# Patient Record
Sex: Female | Born: 1965 | Race: Black or African American | Hispanic: No | Marital: Single | Smoking: Never smoker
Health system: Southern US, Community
[De-identification: ages and names within clinical notes are randomized; demographics above are authoritative.]

## PROBLEM LIST (undated history)

## (undated) DIAGNOSIS — D649 Anemia, unspecified: Secondary | ICD-10-CM

---

## 2002-12-24 ENCOUNTER — Emergency Department (HOSPITAL_COMMUNITY): Admission: EM | Admit: 2002-12-24 | Discharge: 2002-12-25 | Payer: Self-pay | Admitting: Emergency Medicine

## 2002-12-24 ENCOUNTER — Encounter: Payer: Self-pay | Admitting: Emergency Medicine

## 2003-08-15 ENCOUNTER — Emergency Department (HOSPITAL_COMMUNITY): Admission: EM | Admit: 2003-08-15 | Discharge: 2003-08-15 | Payer: Self-pay | Admitting: Emergency Medicine

## 2005-01-02 ENCOUNTER — Emergency Department (HOSPITAL_COMMUNITY): Admission: EM | Admit: 2005-01-02 | Discharge: 2005-01-02 | Payer: Self-pay | Admitting: Family Medicine

## 2005-03-07 ENCOUNTER — Emergency Department (HOSPITAL_COMMUNITY): Admission: EM | Admit: 2005-03-07 | Discharge: 2005-03-07 | Payer: Self-pay | Admitting: Family Medicine

## 2005-11-23 ENCOUNTER — Emergency Department (HOSPITAL_COMMUNITY): Admission: EM | Admit: 2005-11-23 | Discharge: 2005-11-23 | Payer: Self-pay | Admitting: Family Medicine

## 2007-09-29 ENCOUNTER — Emergency Department (HOSPITAL_COMMUNITY): Admission: EM | Admit: 2007-09-29 | Discharge: 2007-09-29 | Payer: Self-pay | Admitting: Family Medicine

## 2008-01-25 ENCOUNTER — Emergency Department (HOSPITAL_COMMUNITY): Admission: EM | Admit: 2008-01-25 | Discharge: 2008-01-25 | Payer: Self-pay | Admitting: Family Medicine

## 2008-12-27 ENCOUNTER — Emergency Department (HOSPITAL_COMMUNITY): Admission: EM | Admit: 2008-12-27 | Discharge: 2008-12-27 | Payer: Self-pay | Admitting: Family Medicine

## 2009-10-17 ENCOUNTER — Ambulatory Visit (HOSPITAL_COMMUNITY): Admission: RE | Admit: 2009-10-17 | Discharge: 2009-10-17 | Payer: Self-pay | Admitting: Obstetrics & Gynecology

## 2010-10-18 ENCOUNTER — Ambulatory Visit (HOSPITAL_COMMUNITY)
Admission: RE | Admit: 2010-10-18 | Discharge: 2010-10-18 | Payer: Self-pay | Source: Home / Self Care | Admitting: Family Medicine

## 2011-03-11 LAB — POCT RAPID STREP A (OFFICE): Streptococcus, Group A Screen (Direct): NEGATIVE

## 2011-08-18 LAB — POCT PREGNANCY, URINE
Operator id: 208841
Preg Test, Ur: NEGATIVE

## 2011-08-18 LAB — POCT URINALYSIS DIP (DEVICE)
Ketones, ur: NEGATIVE
Operator id: 208841

## 2012-02-05 ENCOUNTER — Other Ambulatory Visit (HOSPITAL_COMMUNITY): Payer: Self-pay | Admitting: Physician Assistant

## 2012-02-05 DIAGNOSIS — Z1231 Encounter for screening mammogram for malignant neoplasm of breast: Secondary | ICD-10-CM

## 2012-02-06 ENCOUNTER — Ambulatory Visit (HOSPITAL_COMMUNITY)
Admission: RE | Admit: 2012-02-06 | Discharge: 2012-02-06 | Disposition: A | Discharge status: Home / Self Care | Payer: Self-pay | Source: Ambulatory Visit | Attending: Physician Assistant | Admitting: Physician Assistant

## 2012-02-06 DIAGNOSIS — Z1231 Encounter for screening mammogram for malignant neoplasm of breast: Secondary | ICD-10-CM

## 2012-03-10 ENCOUNTER — Emergency Department (HOSPITAL_COMMUNITY)
Admission: EM | Admit: 2012-03-10 | Discharge: 2012-03-10 | Disposition: A | Discharge status: Home / Self Care | Payer: Self-pay | Attending: Emergency Medicine | Admitting: Emergency Medicine

## 2012-03-10 ENCOUNTER — Emergency Department (HOSPITAL_COMMUNITY): Payer: Self-pay

## 2012-03-10 ENCOUNTER — Encounter (HOSPITAL_COMMUNITY): Payer: Self-pay | Admitting: Emergency Medicine

## 2012-03-10 DIAGNOSIS — Z79899 Other long term (current) drug therapy: Secondary | ICD-10-CM | POA: Insufficient documentation

## 2012-03-10 DIAGNOSIS — IMO0001 Reserved for inherently not codable concepts without codable children: Secondary | ICD-10-CM | POA: Insufficient documentation

## 2012-03-10 DIAGNOSIS — S80219A Abrasion, unspecified knee, initial encounter: Secondary | ICD-10-CM

## 2012-03-10 DIAGNOSIS — M25561 Pain in right knee: Secondary | ICD-10-CM

## 2012-03-10 DIAGNOSIS — M25569 Pain in unspecified knee: Secondary | ICD-10-CM | POA: Insufficient documentation

## 2012-03-10 DIAGNOSIS — M25469 Effusion, unspecified knee: Secondary | ICD-10-CM | POA: Insufficient documentation

## 2012-03-10 DIAGNOSIS — IMO0002 Reserved for concepts with insufficient information to code with codable children: Secondary | ICD-10-CM | POA: Insufficient documentation

## 2012-03-10 NOTE — Discharge Instructions (Signed)
Your x-ray did not show any signs of broken bones. This may represent a "bone bruise." You have been given a knee sleeve for extra support. Please wear this for the next several days. Use ice over the area and elevate the leg. You can take ibuprofen as needed for pain. Continue to use antibacterial ointment, such as Neosporin or bacitracin, over the abrasion. If you notice increased discharge from the area, redness around the wound, increased swelling, or any other worrisome symptoms, please return to the ED.  RESOURCE GUIDE  Dental Problems  Patients with Medicaid: [facility] [phone] [address]                                           [phone] [address] Phone:  [phone]                                                  Phone:  [phone]  If unable to pay or uninsured, contact:  [facility] or [facility] to become qualified for the adult dental clinic.  Chronic Pain Problems Contact [facility]  [phone] Patients need to be referred by their primary care doctor.  Insufficient Money for Medicine Contact [employer] Way:  call "211" or [facility] 973-719-3646.  No Primary Care Doctor Call [facility]  214-666-1506 Other agencies that provide inexpensive medical care    [facility]  [phone]    [facility]  [phone]    [facility]  [phone]    [facility]  [phone]    [facility]  501-724-1593    [facility]  [phone]  Psychological Services [facility]  [phone] [facility]  [phone] [facility]   [phone] (emergency services [phone])  Substance Abuse Resources [facility]  320-561-0408 [facility] 2898291454 [facility] [place] 262-667-2621 Floydene Flock 272-673-8684 Residential & Outpatient Substance Abuse Program  618-345-4113  Abuse/Neglect [facility] Child Abuse Hotline 7014707159 [facility] Child Abuse Hotline (705)145-6717 (After Hours)  Emergency Shelter [facility] 936-724-0061  Maternity Homes [facility] [place] [facility] ([phone] [facility] 364-264-3011  MRSA Hotline #:   (669) 751-6729    [facility] Resources  [facility]     [employer]                          [facility] Dept. [address] [place]                       [address]      [address]  Patrecia Pace  [facility] Phone:  [phone]                                   Phone:  [phone]                 Phone:  [phone]  [facility] Phone:  [phone]  [facility] Child Abuse Hotline 365-368-1745 712-355-5474 (After Hours)  Abrasions Abrasions are skin scrapes. Their treatment depends on how large and deep the abrasion is. Abrasions do not extend through all layers of the skin. A cut or lesion through all skin layers is called a laceration. HOME CARE INSTRUCTIONS   If you were given a dressing, change it at least once a day or as instructed by your caregiver. If the bandage sticks, soak it off with a solution of water or hydrogen peroxide.   Twice a day, wash the area with soap and water to remove all the cream/ointment. You may do this in a sink, under a tub faucet, or in a shower. Rinse off the soap and pat dry with a clean towel. Look for signs of infection (see below).   Reapply cream/ointment according to your caregiver's instruction. This will help prevent infection and keep the bandage from sticking. Telfa or gauze over the wound and under the dressing or wrap will also help keep the bandage from sticking.   If the bandage becomes wet, dirty, or develops a foul smell, change it as soon as possible.   Only take over-the-counter or  prescription medicines for pain, discomfort, or fever as directed by your caregiver.  SEEK IMMEDIATE MEDICAL CARE IF:   Increasing pain in the wound.   Signs of infection develop: redness, swelling, surrounding area is tender to touch, or pus coming from the wound.   You have a fever.   Any foul smell coming from the wound or dressing.  Most skin wounds heal within ten days. Facial wounds heal faster. However, an infection may occur despite proper treatment. You should have the wound checked for signs of infection within 24 to 48 hours or sooner if problems arise. If you were not given a wound-check appointment, look closely at the wound yourself on the second day for early signs of infection listed above. MAKE SURE YOU:   Understand these instructions.   Will watch your condition.   Will get help right away if you are not doing well or get worse.  Document Released: 08/20/2005 Document Revised: 10/30/2011 Document Reviewed: 10/14/2011 [facility] Patient Information 2012 [place], [place].Knee Pain The knee is the complex joint between your thigh and your lower leg. It is made up of bones, tendons, ligaments, and cartilage. The bones that make up the knee are:  The femur in the thigh.   The tibia and fibula in the lower leg.   The patella or kneecap riding in the groove on the lower femur.  CAUSES  Knee pain is a common complaint with many causes. A few of these causes are:  Injury, such as:   A ruptured ligament or tendon injury.   Torn cartilage.   Medical conditions, such as:   Gout   Arthritis   Infections   Overuse, over training or overdoing a physical activity.  Knee pain can be minor or severe. Knee pain can accompany debilitating injury. Minor knee problems often respond well to self-care measures or get well on their own. More serious injuries  may need medical intervention or even surgery. SYMPTOMS The knee is complex. Symptoms of knee problems can vary widely.  Some of the problems are:  Pain with movement and weight bearing.   Swelling and tenderness.   Buckling of the knee.   Inability to straighten or extend your knee.   Your knee locks and you cannot straighten it.   Warmth and redness with pain and fever.   Deformity or dislocation of the kneecap.  DIAGNOSIS  Determining what is wrong may be very straight forward such as when there is an injury. It can also be challenging because of the complexity of the knee. Tests to make a diagnosis may include:  Your caregiver taking a history and doing a physical exam.   Routine X-rays can be used to rule out other problems. X-rays will not reveal a cartilage tear. Some injuries of the knee can be diagnosed by:   Arthroscopy a surgical technique by which a small video camera is inserted through tiny incisions on the sides of the knee. This procedure is used to examine and repair internal knee joint problems. Tiny instruments can be used during arthroscopy to repair the torn knee cartilage (meniscus).   Arthrography is a radiology technique. A contrast liquid is directly injected into the knee joint. Internal structures of the knee joint then become visible on X-ray film.   An MRI scan is a non x-ray radiology procedure in which magnetic fields and a computer produce two- or three-dimensional images of the inside of the knee. Cartilage tears are often visible using an MRI scanner. MRI scans have largely replaced arthrography in diagnosing cartilage tears of the knee.   Blood work.   Examination of the fluid that helps to lubricate the knee joint (synovial fluid). This is done by taking a sample out using a needle and a syringe.  TREATMENT The treatment of knee problems depends on the cause. Some of these treatments are:  Depending on the injury, proper casting, splinting, surgery or physical therapy care will be needed.   Give yourself adequate recovery time. Do not overuse your joints. If you  begin to get sore during workout routines, back off. Slow down or do fewer repetitions.   For repetitive activities such as cycling or running, maintain your strength and nutrition.   Alternate muscle groups. For example if you are a weight lifter, work the upper body on one day and the lower body the next.   Either tight or weak muscles do not give the proper support for your knee. Tight or weak muscles do not absorb the stress placed on the knee joint. Keep the muscles surrounding the knee strong.   Take care of mechanical problems.   If you have flat feet, orthotics or special shoes may help. See your caregiver if you need help.   Arch supports, sometimes with wedges on the inner or outer aspect of the heel, can help. These can shift pressure away from the side of the knee most bothered by osteoarthritis.   A brace called an "unloader" brace also may be used to help ease the pressure on the most arthritic side of the knee.   If your caregiver has prescribed crutches, braces, wraps or ice, use as directed. The acronym for this is PRICE. This means protection, rest, ice, compression and elevation.   Nonsteroidal anti-inflammatory drugs (NSAID's), can help relieve pain. But if taken immediately after an injury, they may actually increase swelling. Take NSAID's with food in your  stomach. Stop them if you develop stomach problems. Do not take these if you have a history of ulcers, stomach pain or bleeding from the bowel. Do not take without your caregiver's approval if you have problems with fluid retention, heart failure, or kidney problems.   For ongoing knee problems, physical therapy may be helpful.   Glucosamine and chondroitin are over-the-counter dietary supplements. Both may help relieve the pain of osteoarthritis in the knee. These medicines are different from the usual anti-inflammatory drugs. Glucosamine may decrease the rate of cartilage destruction.   Injections of a corticosteroid  drug into your knee joint may help reduce the symptoms of an arthritis flare-up. They may provide pain relief that lasts a few months. You may have to wait a few months between injections. The injections do have a small increased risk of infection, water retention and elevated blood sugar levels.   Hyaluronic acid injected into damaged joints may ease pain and provide lubrication. These injections may work by reducing inflammation. A series of shots may give relief for as long as 6 months.   Topical painkillers. Applying certain ointments to your skin may help relieve the pain and stiffness of osteoarthritis. Ask your pharmacist for suggestions. Many over the-counter products are approved for temporary relief of arthritis pain.   In some countries, doctors often prescribe topical NSAID's for relief of chronic conditions such as arthritis and tendinitis. A review of treatment with NSAID creams found that they worked as well as oral medications but without the serious side effects.  PREVENTION  Maintain a healthy weight. Extra pounds put more strain on your joints.   Get strong, stay limber. Weak muscles are a common cause of knee injuries. Stretching is important. Include flexibility exercises in your workouts.   Be smart about exercise. If you have osteoarthritis, chronic knee pain or recurring injuries, you may need to change the way you exercise. This does not mean you have to stop being active. If your knees ache after jogging or playing basketball, consider switching to swimming, water aerobics or other low-impact activities, at least for a few days a week. Sometimes limiting high-impact activities will provide relief.   Make sure your shoes fit well. Choose footwear that is right for your sport.   Protect your knees. Use the proper gear for knee-sensitive activities. Use kneepads when playing volleyball or laying carpet. Buckle your seat belt every time you drive. Most shattered kneecaps occur  in car accidents.   Rest when you are tired.  SEEK MEDICAL CARE IF:  You have knee pain that is continual and does not seem to be getting better.  SEEK IMMEDIATE MEDICAL CARE IF:  Your knee joint feels hot to the touch and you have a high fever. MAKE SURE YOU:   Understand these instructions.   Will watch your condition.   Will get help right away if you are not doing well or get worse.  Document Released: 09/07/2007 Document Revised: 10/30/2011 Document Reviewed: 09/07/2007 [facility] Patient Information 2012 [place], [place].

## 2012-03-10 NOTE — ED Provider Notes (Signed)
History     CSN: [id number]  Arrival date & time 03/10/12  1727   First MD Initiated Contact with Patient 03/10/12 2037      Chief Complaint  Patient presents with  . Knee Pain    (Consider location/radiation/quality/duration/timing/severity/associated sxs/prior treatment) Patient is a 46 y.o. female presenting with knee pain. The history is provided by the patient.  Knee Pain This is a new problem. The current episode started in the past 7 days. The problem occurs constantly. The problem has been unchanged. Associated symptoms include myalgias. Pertinent negatives include no chills, fever, joint swelling or numbness.   Pt tripped and fell down about three steps on Sunday. She landed on her knee; doesn't recall any twisting motion. She did suffer abrasions to the knee. States she has generalized throbbing pain to the knee, which gets worse with walking. Has noted some slight swelling. Denies distal numbness, weakness, inability to bear wt, feeling as if the knee is going to "give out."  History reviewed. No pertinent past medical history.  History reviewed. No pertinent past surgical history.  Family History  Problem Relation Age of Onset  . Coronary artery disease Mother   . Diabetes Father     History  Substance Use Topics  . Smoking status: Never Smoker   . Smokeless tobacco: Not on file  . Alcohol Use: No    OB History    Grav Para Term Preterm Abortions TAB SAB Ect Mult Living                  Review of Systems  Constitutional: Negative for fever and chills.  Musculoskeletal: Positive for myalgias. Negative for joint swelling.  Skin: Positive for wound.  Neurological: Negative for numbness.    Allergies  Review of patient's allergies indicates no known allergies.  Home Medications   Current Outpatient Rx  Name Route Sig Dispense Refill  . IBUPROFEN 400 MG PO TABS Oral Take 400 mg by mouth every 6 (six) hours as needed. For pain relief    . ADULT  MULTIVITAMIN W/MINERALS CH Oral Take 1 tablet by mouth daily.    Jeananne Rama SULFATE 0.05-0.25 % OP SOLN Both Eyes Place 2 drops into both eyes 3 (three) times daily as needed. Eye irritation/ itchy eye      BP 124/66  Pulse 76  Temp(Src) 98.2 F (36.8 C) (Oral)  Resp 18  Wt 175 lb (79.379 kg)  SpO2 100%  LMP 02/08/2012  Physical Exam  Nursing note and vitals reviewed. Constitutional: She appears well-developed and well-nourished. No distress.  HENT:  Head: Normocephalic and atraumatic.  Neck: Normal range of motion.  Cardiovascular: Normal rate.   Pulmonary/Chest: Effort normal.  Musculoskeletal: Normal range of motion.       R knee: abrasion over the knee; edges pink, healing well without drainage. Generalized mild ttp. No edema noted; no gross effusion or baker's cyst. FROM, ligaments grossly intact to drawer/stress test. McMurray's neg. Distally NVI.   Neurological: She is alert.  Skin: Skin is warm and dry. She is not diaphoretic.  Psychiatric: She has a normal mood and affect.    ED Course  Procedures (including critical care time)  Labs Reviewed - No data to display Dg Knee Complete 4 Views Right  03/10/2012  *RADIOLOGY REPORT*  Clinical Data: Fall with knee pain all over  RIGHT KNEE - COMPLETE 4+ VIEW  Comparison: None.  Findings: Normal alignment and bony mineralization.  The joint spaces are maintained.  No acute  bony abnormality is identified. No evidence of joint effusion.  No focal soft tissue swelling is seen.  IMPRESSION: No acute bony abnormality.  Original Report Authenticated By: Britta Mccreedy, M.D.     1. Knee abrasion   2. Right knee pain       MDM  Pt presents with knee pain after a fall over the weekend. Xrays which I personally reviewed are negative for fx or other acute pathology. Exam unremarkable other than abrasions. Instructed on wound care, recommended ibuprofen and RICE. Pt given knee sleeve for extra support. Instructed to f/u with  PCP if not improving. Return precautions discussed.        Grant Fontana, [place] 03/11/12 [phone]

## 2012-03-10 NOTE — ED Notes (Signed)
Transported to xray 

## 2012-03-10 NOTE — ED Notes (Signed)
Pt states she fell down some steps late Sunday evening and landed on her right knee   Pt states she has bruising, swelling, and pain

## 2012-03-11 NOTE — ED Provider Notes (Signed)
Medical screening examination/treatment/procedure(s) were performed by non-physician practitioner and as supervising physician I was immediately available for consultation/collaboration. Hurley Blevins, MD, FACEP   Gregrey Bloyd L Cayce Paschal, MD 03/11/12 2125 

## 2013-07-09 ENCOUNTER — Emergency Department (INDEPENDENT_AMBULATORY_CARE_PROVIDER_SITE_OTHER): Payer: Self-pay

## 2013-07-09 ENCOUNTER — Encounter (HOSPITAL_COMMUNITY): Payer: Self-pay | Admitting: *Deleted

## 2013-07-09 ENCOUNTER — Emergency Department (INDEPENDENT_AMBULATORY_CARE_PROVIDER_SITE_OTHER)
Admission: EM | Admit: 2013-07-09 | Discharge: 2013-07-09 | Disposition: A | Discharge status: Home / Self Care | Payer: Self-pay | Source: Home / Self Care | Attending: Emergency Medicine | Admitting: Emergency Medicine

## 2013-07-09 DIAGNOSIS — S93409A Sprain of unspecified ligament of unspecified ankle, initial encounter: Secondary | ICD-10-CM

## 2013-07-09 DIAGNOSIS — S93401A Sprain of unspecified ligament of right ankle, initial encounter: Secondary | ICD-10-CM

## 2013-07-09 MED ORDER — MELOXICAM 15 MG PO TABS: 15.0000 mg | ORAL_TABLET | Freq: Every day | ORAL | Status: AC

## 2013-07-09 NOTE — ED Provider Notes (Signed)
Chief Complaint:   Chief Complaint  Patient presents with  . Foot Injury    History of Present Illness:   SAMA Vanessa Castillo is a 47 year old female who was working out, doing some squats and lunges 2 weeks ago when she twisted her right ankle and heard a pop. She has swelling over the lateral malleolus since then and pain with ambulation and movement. She is able to bear weight. She denies any numbness or tingling.  Review of Systems:  Other than noted above, the patient denies any of the following symptoms: Systemic:  No fevers, chills, sweats, or aches.   Musculoskeletal:  No joint pain, arthritis, bursitis, swelling, back pain, or neck pain. Neurological:  No muscular weakness or paresthesias.  PMFSH:  Past medical history, family history, social history, meds, and allergies were reviewed.   Physical Exam:   Vital signs:  BP 114/70  Pulse 81  Temp(Src) 97.9 F (36.6 C) (Oral)  Resp 16  SpO2 100%  LMP 07/04/2013 Gen:  Alert and oriented times 3.  In no distress. Musculoskeletal: Exam of the ankle reveals swelling and pain to palpation over the lateral malleolus. Ankle joint has a limited range of motion with pain. Anterior drawer sign negative.  Talar tilt negative. Squeeze test negative. Achilles tendon, peroneal tendon, and tibialis posterior were intact. Otherwise, all joints had a full a ROM with no swelling, bruising or deformity.  No edema, pulses full. Extremities were warm and pink.  Capillary refill was brisk.  Skin:  Clear, warm and dry.  No rash. Neuro:  Alert and oriented times 3.  Muscle strength was normal.  Sensation was intact to light touch.   Radiology:  Dg Ankle Complete Right  07/09/2013   *RADIOLOGY REPORT*  Clinical Data: Twisted ankle while exercising 2 weeks ago  RIGHT ANKLE - COMPLETE 3+ VIEW  Comparison: None  Findings: Three views of the right ankle submitted.  No acute fracture or subluxation.  Ankle mortise is preserved.  Soft tissue swelling is noted adjacent  to lateral malleolus.  IMPRESSION: No acute fracture or subluxation.  Lateral soft tissue swelling.   Original Report Authenticated By: Natasha Mead, M.D.   I reviewed the images independently and personally and concur with the radiologist's findings.  Course in Urgent Care Center:   Placed in an ASO brace.  Assessment:  The encounter diagnosis was Ankle sprain, right, initial encounter.  This should heal up with time. She was given exercises to do and suggested followup with an orthopedist if no better in 2 weeks.  Plan:   1.  The following meds were prescribed:   Discharge Medication List as of 07/09/2013  2:57 PM    START taking these medications   Details  meloxicam (MOBIC) 15 MG tablet Take 1 tablet (15 mg total) by mouth daily., Starting 07/09/2013, Until Discontinued, Normal       2.  The patient was instructed in symptomatic care, including rest and activity, elevation, application of ice and compression.  Appropriate handouts were given. 3.  The patient was told to return if becoming worse in any way, if no better in 3 or 4 days, and given some red flag symptoms such as worsening pain or neurological symptoms that would indicate earlier return.   4.  The patient was told to follow up with Dr. Renaye Rakers in 2 weeks if no better.    Reuben Likes, MD 07/09/13 2017

## 2013-07-09 NOTE — ED Notes (Signed)
Patient states that she was doing squats with weights and went down and heard a pop or snap from her right foot and ankle; states swelling and pain.

## 2013-09-29 ENCOUNTER — Other Ambulatory Visit: Payer: Self-pay | Admitting: Obstetrics and Gynecology

## 2013-09-29 DIAGNOSIS — Z1231 Encounter for screening mammogram for malignant neoplasm of breast: Secondary | ICD-10-CM

## 2013-11-04 ENCOUNTER — Other Ambulatory Visit (HOSPITAL_COMMUNITY): Payer: Self-pay | Admitting: *Deleted

## 2013-11-04 DIAGNOSIS — N6320 Unspecified lump in the left breast, unspecified quadrant: Secondary | ICD-10-CM

## 2013-11-08 ENCOUNTER — Ambulatory Visit (HOSPITAL_COMMUNITY)
Admission: RE | Admit: 2013-11-08 | Discharge: 2013-11-08 | Disposition: A | Discharge status: Home / Self Care | Payer: Self-pay | Source: Ambulatory Visit | Attending: Obstetrics and Gynecology | Admitting: Obstetrics and Gynecology

## 2013-11-08 ENCOUNTER — Encounter (INDEPENDENT_AMBULATORY_CARE_PROVIDER_SITE_OTHER): Payer: Self-pay

## 2013-11-08 ENCOUNTER — Encounter (HOSPITAL_COMMUNITY): Payer: Self-pay

## 2013-11-08 ENCOUNTER — Ambulatory Visit (HOSPITAL_COMMUNITY): Payer: Self-pay

## 2013-11-08 ENCOUNTER — Other Ambulatory Visit: Payer: Self-pay | Admitting: Obstetrics and Gynecology

## 2013-11-08 VITALS — BP 112/78 | Temp 98.5°F | Ht 67.0 in | Wt 171.8 lb

## 2013-11-08 DIAGNOSIS — N898 Other specified noninflammatory disorders of vagina: Secondary | ICD-10-CM

## 2013-11-08 DIAGNOSIS — N6323 Unspecified lump in the left breast, lower outer quadrant: Secondary | ICD-10-CM | POA: Insufficient documentation

## 2013-11-08 DIAGNOSIS — Z01419 Encounter for gynecological examination (general) (routine) without abnormal findings: Secondary | ICD-10-CM

## 2013-11-08 NOTE — Progress Notes (Signed)
Complaints of left breast lump x 1 month.  Pap Smear:    Pap smear completed today. Patients last Pap smear was 02/02/2009 and normal. Per patient has no history of an abnormal Pap smear. Pap smear result above is in EPIC.  Physical exam: Breasts Breasts symmetrical. No skin abnormalities bilateral breasts. No nipple retraction bilateral breasts. No nipple discharge bilateral breasts. No lymphadenopathy. No lumps palpated right breast. Palpated a mobile lump within the left breast at 4 o'clock. No complaints of pain or tenderness on exam. Referred patient to the [facility] for diagnostic mammogram and possible left breast ultrasound. Appointment scheduled for Tuesday, November 15, 2013 at 1330.     Pelvic/Bimanual   Ext Genitalia No lesions, no swelling and no discharge observed on external genitalia.         Vagina Vagina pink and normal texture. No lesions and white milky discharge observed in vagina. Wet prep completed.         Cervix Cervix is present. Cervix pink and of normal texture. Cervix friable. White milky discharge observed on cervix.     Uterus Uterus is present and palpable. Uterus in normal position and normal size.        Adnexae Bilateral ovaries present and palpable. No tenderness on palpation.          Rectovaginal No rectal exam completed today since patient had no rectal complaints. No skin abnormalities observed on exam.

## 2013-11-08 NOTE — Patient Instructions (Signed)
Taught Vanessa Castillo how to perform BSE and gave educational materials to take home. Let her know [facility] will cover Pap smears every 3 years unless has a history of abnormal Pap smears. Referred patient to the [facility] for diagnostic mammogram and possible left breast ultrasound. Appointment scheduled for Tuesday, November 15, 2013 at 1330. Smoking cessation discussed. Patient scheduled for an appointment with the [facility] program for Friday, December 09, 2013 at 0900. Patient aware of appointment and will be there. Let patient know will follow up with her within the next couple weeks with results. Vanessa Castillo verbalized understanding.  Versa Craton, Kathaleen Maser, RN 11:44 AM

## 2013-11-09 ENCOUNTER — Telehealth (HOSPITAL_COMMUNITY): Payer: Self-pay | Admitting: *Deleted

## 2013-11-09 ENCOUNTER — Other Ambulatory Visit (HOSPITAL_COMMUNITY): Payer: Self-pay | Admitting: *Deleted

## 2013-11-09 DIAGNOSIS — B9689 Other specified bacterial agents as the cause of diseases classified elsewhere: Secondary | ICD-10-CM

## 2013-11-09 LAB — WET PREP, GENITAL: Yeast Wet Prep HPF POC: NONE SEEN

## 2013-11-09 MED ORDER — METRONIDAZOLE 500 MG PO TABS: 500.0000 mg | ORAL_TABLET | Freq: Two times a day (BID) | ORAL | Status: DC

## 2013-11-09 NOTE — Telephone Encounter (Signed)
Telephoned patient at home # and discussed negative pap smear. Next pap smear due in 3 years. Also medication called in for bacterial vaginosis. Patient voiced understanding.

## 2013-11-15 ENCOUNTER — Ambulatory Visit
Admission: RE | Admit: 2013-11-15 | Discharge: 2013-11-15 | Disposition: A | Discharge status: Home / Self Care | Payer: No Typology Code available for payment source | Source: Ambulatory Visit | Attending: Obstetrics and Gynecology | Admitting: Obstetrics and Gynecology

## 2013-11-15 ENCOUNTER — Other Ambulatory Visit (HOSPITAL_COMMUNITY): Payer: Self-pay | Admitting: Obstetrics and Gynecology

## 2013-11-15 DIAGNOSIS — N6320 Unspecified lump in the left breast, unspecified quadrant: Secondary | ICD-10-CM

## 2013-11-18 ENCOUNTER — Other Ambulatory Visit (HOSPITAL_COMMUNITY): Payer: Self-pay | Admitting: Obstetrics and Gynecology

## 2013-11-21 ENCOUNTER — Telehealth (HOSPITAL_COMMUNITY): Payer: Self-pay | Admitting: *Deleted

## 2013-11-21 NOTE — Telephone Encounter (Signed)
Patient called me back. Patient stated she is having a lot of vaginal discomfort and the medicine didn't help. Told her she can follow up at [facility] or can refer her to the [facility]. Let her know that [facility] doesn't cover the cost of the follow up for vaginal infections. Let her know that more than likely the first available appointment at the [facility] would be 3-4 weeks but offered to refer her. Patient stated she will find somewhere to follow up. Let her know if she needs any help with scheduling a follow up appointment to feel free to give me a call. Patient verbalized understanding.

## 2013-11-21 NOTE — Telephone Encounter (Signed)
Patient called and left me a voicemail stating she still has vaginal discharge after she took the Flagyl that was prescribed. Patient stated it didn't work. She stated on the message she has seen an increase in vaginal discharge. Attempted to call patient back. No one answered phone. Left voicemail for patient to call Sabrina or I back.

## 2013-11-23 ENCOUNTER — Other Ambulatory Visit: Payer: Self-pay

## 2013-12-02 ENCOUNTER — Other Ambulatory Visit (HOSPITAL_COMMUNITY): Payer: Self-pay | Admitting: Obstetrics and Gynecology

## 2013-12-02 ENCOUNTER — Ambulatory Visit
Admission: RE | Admit: 2013-12-02 | Discharge: 2013-12-02 | Disposition: A | Discharge status: Home / Self Care | Payer: No Typology Code available for payment source | Source: Ambulatory Visit | Attending: Obstetrics and Gynecology | Admitting: Obstetrics and Gynecology

## 2013-12-02 DIAGNOSIS — N6320 Unspecified lump in the left breast, unspecified quadrant: Secondary | ICD-10-CM

## 2013-12-09 ENCOUNTER — Ambulatory Visit: Payer: Self-pay

## 2013-12-12 ENCOUNTER — Telehealth (HOSPITAL_COMMUNITY): Payer: Self-pay | Admitting: *Deleted

## 2013-12-12 NOTE — Telephone Encounter (Signed)
Patient called me due to schedule follow up from Breast Biopsy. Patient stated she has received the results thought she was told that she needed to schedule a follow up. Let her know per the note in EPIC that she needs a screening mammogram in one year. Let her know that if she has any questions about her results for her breast biopsy to call the [facility]. Told patient she will need to come back to [facility] prior to getting her yearly screening mammogram. Patient verbalized understanding.

## 2013-12-14 ENCOUNTER — Other Ambulatory Visit: Payer: Self-pay

## 2013-12-16 ENCOUNTER — Ambulatory Visit: Payer: Self-pay

## 2013-12-16 ENCOUNTER — Other Ambulatory Visit: Payer: Self-pay

## 2013-12-30 ENCOUNTER — Ambulatory Visit (HOSPITAL_BASED_OUTPATIENT_CLINIC_OR_DEPARTMENT_OTHER): Payer: Self-pay

## 2013-12-30 ENCOUNTER — Other Ambulatory Visit: Payer: Self-pay

## 2013-12-30 VITALS — BP 128/88 | HR 72 | Temp 98.4°F | Resp 16 | Ht 66.0 in | Wt 170.0 lb

## 2013-12-30 DIAGNOSIS — I10 Essential (primary) hypertension: Secondary | ICD-10-CM

## 2013-12-30 LAB — LIPID PANEL
CHOLESTEROL: 188 mg/dL (ref 0–200)
HDL: 48 mg/dL (ref >39)
LDL CALC: 107 mg/dL — AB (ref 0–99)
TRIGLYCERIDES: 167 mg/dL — AB (ref <150)
Total CHOL/HDL Ratio: 3.9 Ratio
VLDL: 33 mg/dL (ref 0–40)

## 2013-12-30 LAB — HEMOGLOBIN A1C
HEMOGLOBIN A1C: 5.7 % — AB (ref <5.7)
MEAN PLASMA GLUCOSE: 117 mg/dL — AB (ref <117)

## 2013-12-30 LAB — GLUCOSE (CC13): GLUCOSE: 81 mg/dL (ref 70–140)

## 2013-12-30 NOTE — Progress Notes (Signed)
[facility] and is currently a [facility] patient effective 11/08/2013.  Clinical Measurements: Patient is 5 ft. 6 inches, weight 169.8 lbs, waist circumference 35 inches, and hip circumference 40.75inches.   Medical History: Patient has no history of high cholesterol. Patient does not have a history of hypertension or diabetes. Per patient no diagnosed history of coronary heart disease, heart attack, heart failure, stroke/TIA, vascular disease or congenital heart defects.   Blood Pressure, Self-measurement: Patient states has no reason to check Blood pressure.  Nutrition Assessment: Patient stated that eats 4-5 fruits every day. Patient states she eats one to 2 servings of vegetables a day.. She  eat 2 or more ounces of whole grains daily. Patient doesn't eat two or more servings of fish weekly. Patient states does not like seafood. She got sick on it when she was young. Patient states she does not drink more than 36 ounces or 450 calories of beverages with added sugars weekly. Patient states she drinks a lot of water. Patient stated she does not watch her salt intake. She states she has to have a lot of salt on food.  Per patient she  Drink multiple shots of liquor a night.  Physical Activity Assessment: Patient stated she does power walking every day for about 15 minutes.   Smoking Status: Patient had never smoked until 6 months ago. Her present boyfriend is a heavy smoker. She smokes about one small cigar a night. Patient is exposed to smoke a half a hour  A day.  Quality of Life Assessment: In assessing patient's quality of life she stated that out of the past 30 days that she has felt her health is good all of them. Patient also stated that in the past 30 days that her mental health is not good including stress, depression and problems with emotions for 21 days. Patient did state that out of the past 30 days she felt her physical or mental health had not kept  her from doing her usual activities including self-care, work or recreation.   Plan: Lab work will be done today including a lipid panel, blood glucose, and Hgb A1C. Will call lab results when they are finished. Patient will returned on February12th for Health Coaching and BP check. Will work on behavior modifications that we discussed to lower BP.

## 2013-12-30 NOTE — Patient Instructions (Signed)
Discussed health assessment with patient. Informed patient that she would need to be followed up for nutrition and blood pressure. She will be called with results of lab work and we will then discussed any further follow up the patient needs. Patient will implement behavior modifications to help lower BP. Patient verbalized understanding. 

## 2014-01-02 ENCOUNTER — Telehealth: Payer: Self-pay

## 2014-01-02 NOTE — Telephone Encounter (Signed)
Left message for her to return call about lab work and [facility].  I called [facility] for an appointment about Labs and BP and patient was put on a waiting list.

## 2014-01-02 NOTE — Telephone Encounter (Signed)
Vanessa Castillo returned call and I informed her of the following lab results: Glucose- 81, Hbg A1C - 5.7, mean 117, Cholesterol - 188, Triglycerides - 167,   HDl - 48, LDL - 107. I informed patient That I had called [facility] for an appointment. The clinic will contact her with an appointment. Reminded her of her health Coaching appointment on 01/05/14 at 3pm.

## 2014-01-05 ENCOUNTER — Ambulatory Visit: Payer: Self-pay

## 2014-01-12 ENCOUNTER — Encounter: Payer: Self-pay | Admitting: Internal Medicine

## 2014-01-12 ENCOUNTER — Ambulatory Visit: Payer: Self-pay | Attending: Internal Medicine | Admitting: Internal Medicine

## 2014-01-12 ENCOUNTER — Ambulatory Visit (HOSPITAL_BASED_OUTPATIENT_CLINIC_OR_DEPARTMENT_OTHER): Payer: Self-pay

## 2014-01-12 VITALS — BP 118/79 | HR 89 | Temp 98.7°F | Resp 14 | Ht 66.0 in | Wt 166.0 lb

## 2014-01-12 DIAGNOSIS — G4700 Insomnia, unspecified: Secondary | ICD-10-CM | POA: Insufficient documentation

## 2014-01-12 DIAGNOSIS — Z23 Encounter for immunization: Secondary | ICD-10-CM | POA: Insufficient documentation

## 2014-01-12 DIAGNOSIS — Z0000 Encounter for general adult medical examination without abnormal findings: Secondary | ICD-10-CM

## 2014-01-12 DIAGNOSIS — Z006 Encounter for examination for normal comparison and control in clinical research program: Secondary | ICD-10-CM | POA: Insufficient documentation

## 2014-01-12 DIAGNOSIS — Z789 Other specified health status: Secondary | ICD-10-CM

## 2014-01-12 LAB — CBC WITH DIFFERENTIAL/PLATELET
BASOS PCT: 0 % (ref 0–1)
Basophils Absolute: 0 10*3/uL (ref 0.0–0.1)
EOS PCT: 2 % (ref 0–5)
Eosinophils Absolute: 0.2 10*3/uL (ref 0.0–0.7)
HEMATOCRIT: 37.2 % (ref 36.0–46.0)
HEMOGLOBIN: 12.5 g/dL (ref 12.0–15.0)
Lymphocytes Relative: 23 % (ref 12–46)
Lymphs Abs: 2.1 10*3/uL (ref 0.7–4.0)
MCH: 28.1 pg (ref 26.0–34.0)
MCHC: 33.6 g/dL (ref 30.0–36.0)
MCV: 83.6 fL (ref 78.0–100.0)
MONO ABS: 0.6 10*3/uL (ref 0.1–1.0)
MONOS PCT: 6 % (ref 3–12)
NEUTROS ABS: 6.3 10*3/uL (ref 1.7–7.7)
Neutrophils Relative %: 69 % (ref 43–77)
Platelets: 309 10*3/uL (ref 150–400)
RBC: 4.45 MIL/uL (ref 3.87–5.11)
RDW: 13.5 % (ref 11.5–15.5)
WBC: 9.2 10*3/uL (ref 4.0–10.5)

## 2014-01-12 LAB — COMPLETE METABOLIC PANEL WITH GFR
ALBUMIN: 4.7 g/dL (ref 3.5–5.2)
ALK PHOS: 53 U/L (ref 39–117)
ALT: 18 U/L (ref 0–35)
AST: 18 U/L (ref 0–37)
BUN: 12 mg/dL (ref 6–23)
CALCIUM: 9.7 mg/dL (ref 8.4–10.5)
CHLORIDE: 102 meq/L (ref 96–112)
CO2: 26 mEq/L (ref 19–32)
CREATININE: 0.74 mg/dL (ref 0.50–1.10)
GFR, Est African American: >89 mL/min
GLUCOSE: 71 mg/dL (ref 70–99)
POTASSIUM: 3.7 meq/L (ref 3.5–5.3)
Sodium: 139 mEq/L (ref 135–145)
Total Bilirubin: 0.3 mg/dL (ref 0.2–1.2)
Total Protein: 7.8 g/dL (ref 6.0–8.3)

## 2014-01-12 LAB — POCT GLYCOSYLATED HEMOGLOBIN (HGB A1C): Hemoglobin A1C: 5.1

## 2014-01-12 LAB — TSH: TSH: 1.062 u[IU]/mL (ref 0.350–4.500)

## 2014-01-12 MED ORDER — TRAZODONE HCL 50 MG PO TABS: 25.0000 mg | ORAL_TABLET | Freq: Every evening | ORAL | Status: AC | PRN

## 2014-01-12 NOTE — Progress Notes (Signed)
Patient is here to establish care. Patient has a history of hypertension. BP today is 118/79. Complains of headaches, tiredness, dry cough and trouble sleeping x2 months.

## 2014-01-12 NOTE — Progress Notes (Signed)
Patient returns today for Health Coaching regarding Cholesterol and Triglcerides.  CHOLESTEROL AND LDL's: Gave patient two handouts:Why Do High Triglycerides Matter and Top Five Foods to Lower Your Numbers. Reviewed all handouts together and asked questions. Patient does not like fish so she asked about an omega-3 supplement. Informed that it probably would be good. Ask doctor to make sure.  HEALTH CARE: Discussed the need for Primary Care Physican (PCP).  Discussed the process of obtaining a [employer] Card [employer]).

## 2014-01-12 NOTE — Progress Notes (Signed)
Patient ID: Vanessa Castillo, female   DOB: 06/13/1966, 48 y.o.   MRN: [id number]   CC:  HPI: 48 year old female here to establish care. The patient recently had a Pap smear, breast biopsy, mammogram that was negative. She denies any personal history of hypertension or any other medical issues. She does not sleep well at night because her boyfriend snores. Occasionally the patient also admits to drinking heavily, and can drink up to 40 ounces of hard liquor over the weekend Social history patient now drinks only a couple of drinks a week, smokes a cigar every night  Family history mother has heart transplant secondary to hypertension, heart failure secondary to nicotine dependence Multiple family members have diabetes    No Known Allergies History reviewed. No pertinent past medical history. Current Outpatient Prescriptions on File Prior to Visit  Medication Sig Dispense Refill  . ferrous fumarate (FERRO-SEQUELS) 50 MG CR tablet Take 50 mg by mouth 3 (three) times daily with meals.      Marland Kitchen ibuprofen (ADVIL,MOTRIN) 400 MG tablet Take 400 mg by mouth every 6 (six) hours as needed. For pain relief      . meloxicam (MOBIC) 15 MG tablet Take 1 tablet (15 mg total) by mouth daily.  15 tablet  1  . metroNIDAZOLE (FLAGYL) 500 MG tablet TAKE 1 TABLET TWICE DAILY  14 tablet  0  . Multiple Vitamin (MULITIVITAMIN WITH MINERALS) TABS Take 1 tablet by mouth daily.      Marland Kitchen tetrahydrozoline-zinc (VISINE-AC) 0.05-0.25 % ophthalmic solution Place 2 drops into both eyes 3 (three) times daily as needed. Eye irritation/ itchy eye       No current facility-administered medications on file prior to visit.   Family History  Problem Relation Age of Onset  . Coronary artery disease Mother   . Hypertension Mother   . Diabetes Father   . Breast cancer Paternal Aunt   . Diabetes Paternal Aunt   . Diabetes Paternal Uncle    History   Social History  . Marital Status: Single    Spouse Name: N/A    Number of  Children: N/A  . Years of Education: N/A   Occupational History  . Not on file.   Social History Main Topics  . Smoking status: Never Smoker   . Smokeless tobacco: Never Used  . Alcohol Use: Yes     Comment: socially  . Drug Use: No  . Sexual Activity: Yes    Birth Control/ Protection: None   Other Topics Concern  . Not on file   Social History Narrative  . No narrative on file    Review of Systems  Constitutional: Negative for fever, chills, diaphoresis, activity change, appetite change and fatigue.  HENT: Negative for ear pain, nosebleeds, congestion, facial swelling, rhinorrhea, neck pain, neck stiffness and ear discharge.   Eyes: Negative for pain, discharge, redness, itching and visual disturbance.  Respiratory: Negative for cough, choking, chest tightness, shortness of breath, wheezing and stridor.   Cardiovascular: Negative for chest pain, palpitations and leg swelling.  Gastrointestinal: Negative for abdominal distention.  Genitourinary: Negative for dysuria, urgency, frequency, hematuria, flank pain, decreased urine volume, difficulty urinating and dyspareunia.  Musculoskeletal: Negative for back pain, joint swelling, arthralgias and gait problem.  Neurological: Negative for dizziness, tremors, seizures, syncope, facial asymmetry, speech difficulty, weakness, light-headedness, numbness and headaches.  Hematological: Negative for adenopathy. Does not bruise/bleed easily.  Psychiatric/Behavioral: Negative for hallucinations, behavioral problems, confusion, dysphoric mood, decreased concentration and agitation.    Objective:  Filed Vitals:   01/12/14 1358  BP: 118/79  Pulse: 89  Temp: 98.7 F (37.1 C)  Resp: 14    Physical Exam  Constitutional: Appears well-developed and well-nourished. No distress.  HENT: Normocephalic. External right and left ear normal. Oropharynx is clear and moist.  Eyes: Conjunctivae and EOM are normal. PERRLA, no scleral icterus.   Neck: Normal ROM. Neck supple. No JVD. No tracheal deviation. No thyromegaly.  CVS: RRR, S1/S2 +, no murmurs, no gallops, no carotid bruit.  Pulmonary: Effort and breath sounds normal, no stridor, rhonchi, wheezes, rales.  Abdominal: Soft. BS +,  no distension, tenderness, rebound or guarding.  Musculoskeletal: Normal range of motion. No edema and no tenderness.  Lymphadenopathy: No lymphadenopathy noted, cervical, inguinal. Neuro: Alert. Normal reflexes, muscle tone coordination. No cranial nerve deficit. Skin: Skin is warm and dry. No rash noted. Not diaphoretic. No erythema. No pallor.  Psychiatric: Normal mood and affect. Behavior, judgment, thought content normal.   No results found for this basename: WBC, HGB, HCT, MCV, PLT   No results found for this basename: CREATININE, BUN, NA, K, CL, CO2    Lab Results  Component Value Date   HGBA1C 5.7* 12/30/2013   Lipid Panel     Component Value Date/Time   CHOL 188 12/30/2013 1557   TRIG 167* 12/30/2013 1557   HDL 48 12/30/2013 1557   CHOLHDL 3.9 12/30/2013 1557   VLDL 33 12/30/2013 1557   LDLCALC 107* 12/30/2013 1557       Assessment and plan:   Patient Active Problem List   Diagnosis Date Noted  . Breast lump on left side at 4 o'clock position 11/08/2013       Insomnia Will prescribe trazodone to help her sleep   Establish care Baseline labs Patient refusing flu vaccination Tetanus and hepatitis B vaccination will be provided today, come back and one in 6 months to complete the series Up to date with her Pap smear, mammogram, Follow up in 3 months     The patient was given clear instructions to go to ER or return to medical center if symptoms don't improve, worsen or new problems develop. The patient verbalized understanding. The patient was told to call to get any lab results if not heard anything in the next week.

## 2014-01-12 NOTE — Patient Instructions (Signed)
PLAN: Obtain PCP with orange card. Reduce amount of alcohol intact and sweets. States Understands. Told to call if have any questions. No more Health Coaching needed at this time.

## 2014-01-13 ENCOUNTER — Telehealth: Payer: Self-pay | Admitting: *Deleted

## 2014-01-13 LAB — VITAMIN D 25 HYDROXY (VIT D DEFICIENCY, FRACTURES): VIT D 25 HYDROXY: 29 ng/mL — AB (ref 30–89)

## 2014-01-13 NOTE — Telephone Encounter (Signed)
Call completed as followed. 

## 2014-01-13 NOTE — Telephone Encounter (Signed)
Message copied by Renley Banwart, [place] R on Fri Jan 13, 2014 10:47 AM ------      Message from: Susie CassetteABROL MD, Germain OsgoodNAYANA      Created: Fri Jan 13, 2014 10:36 AM       Notify patient that most labs are normal, vitamin D is 29. Patient can take over-the-counter vitamin D 2000 international units twice a day. ------

## 2014-02-10 ENCOUNTER — Other Ambulatory Visit: Payer: Self-pay

## 2014-02-14 ENCOUNTER — Other Ambulatory Visit: Payer: Self-pay

## 2014-04-10 ENCOUNTER — Ambulatory Visit: Payer: Self-pay | Admitting: Internal Medicine

## 2014-06-12 ENCOUNTER — Other Ambulatory Visit: Payer: Self-pay

## 2014-07-24 ENCOUNTER — Telehealth: Payer: Self-pay

## 2014-07-24 NOTE — Telephone Encounter (Signed)
Patient stated that had cut out her fruits and trying to cut back sweets. Per patient she is doing more exercising.tient asked if needed to come back in.   PLAN: Send 1800 calorie diet with serving sizes. Follow up to see if helped. Explained what would be going on with patient and [facility] program.

## 2014-09-25 ENCOUNTER — Encounter: Payer: Self-pay | Admitting: Internal Medicine

## 2014-12-25 ENCOUNTER — Other Ambulatory Visit: Payer: Self-pay | Admitting: Obstetrics and Gynecology

## 2014-12-25 DIAGNOSIS — Z1231 Encounter for screening mammogram for malignant neoplasm of breast: Secondary | ICD-10-CM

## 2015-01-18 ENCOUNTER — Ambulatory Visit (HOSPITAL_COMMUNITY): Payer: Self-pay

## 2015-01-18 ENCOUNTER — Ambulatory Visit (HOSPITAL_COMMUNITY): Admission: RE | Admit: 2015-01-18 | Payer: Self-pay | Source: Ambulatory Visit

## 2015-02-15 ENCOUNTER — Ambulatory Visit (HOSPITAL_COMMUNITY): Payer: Self-pay

## 2015-02-15 ENCOUNTER — Ambulatory Visit (HOSPITAL_COMMUNITY): Payer: Self-pay | Attending: Obstetrics and Gynecology

## 2015-03-22 ENCOUNTER — Ambulatory Visit (HOSPITAL_COMMUNITY)
Admission: RE | Admit: 2015-03-22 | Discharge: 2015-03-22 | Disposition: A | Discharge status: Home / Self Care | Payer: Self-pay | Source: Ambulatory Visit | Attending: Obstetrics and Gynecology | Admitting: Obstetrics and Gynecology

## 2015-03-22 ENCOUNTER — Encounter (HOSPITAL_COMMUNITY): Payer: Self-pay

## 2015-03-22 VITALS — BP 114/72 | Temp 98.2°F | Ht 66.0 in | Wt 165.4 lb

## 2015-03-22 DIAGNOSIS — Z1239 Encounter for other screening for malignant neoplasm of breast: Secondary | ICD-10-CM

## 2015-03-22 DIAGNOSIS — Z1231 Encounter for screening mammogram for malignant neoplasm of breast: Secondary | ICD-10-CM

## 2015-03-22 HISTORY — DX: Anemia, unspecified: D64.9

## 2015-03-22 NOTE — Patient Instructions (Signed)
Educational materials on self-breast awareness given. Explained to Puanani T Uselman that she did Cephas Darbynot need a Pap smear today due to last Pap smear was 11/08/2013. Let her know [facility] will cover Pap smears every 3 years unless has a history of abnormal Pap smears. Let patient know the [facility] will follow up with her within the next couple weeks with results by letter or phone. Cephas DarbySonya T Procell verbalized understanding. Patient escorted to mammography for a screening mammogram.  Banjamin Stovall, Kathaleen Maserhristine Poll, RN 8:21 AM

## 2015-03-22 NOTE — Progress Notes (Signed)
No complaints today.  Pap Smear:  Pap smear not completed today. [facility]. Per patient has no history of an abnormal Pap smear. Last Pap smear result is in EPIC.  Physical exam: Breasts Breasts symmetrical. No skin abnormalities bilateral breasts. No nipple retraction bilateral breasts. No nipple discharge bilateral breasts. No lymphadenopathy. No lumps palpated bilateral breasts. No complaints of pain or tenderness on exam. Patient escorted to mammography for a screening mammogram.        Pelvic/Bimanual No Pap smear completed today since last Pap smear was 11/08/2013. Pap smear not indicated per [facility] guidelines.

## 2015-03-26 ENCOUNTER — Telehealth: Payer: Self-pay | Admitting: *Deleted

## 2015-03-26 NOTE — Telephone Encounter (Signed)
-----   Message from Ambrose FinlandValerie A Keck, NP sent at 03/23/2015  6:00 PM EDT ----- No evidence of malignancy on mammogram

## 2015-03-26 NOTE — Telephone Encounter (Signed)
Left VM to return my call

## 2015-09-20 ENCOUNTER — Ambulatory Visit: Payer: Self-pay

## 2015-09-20 ENCOUNTER — Other Ambulatory Visit: Payer: Self-pay

## 2016-02-20 ENCOUNTER — Other Ambulatory Visit: Payer: Self-pay

## 2016-02-20 DIAGNOSIS — Z1231 Encounter for screening mammogram for malignant neoplasm of breast: Secondary | ICD-10-CM

## 2016-05-21 ENCOUNTER — Telehealth (HOSPITAL_COMMUNITY): Payer: Self-pay | Admitting: *Deleted

## 2016-05-21 NOTE — Telephone Encounter (Signed)
Telephoned patient at home # and left message to return call to [facility] 

## 2016-06-02 ENCOUNTER — Other Ambulatory Visit: Payer: Self-pay | Admitting: Obstetrics and Gynecology

## 2016-06-02 DIAGNOSIS — Z1231 Encounter for screening mammogram for malignant neoplasm of breast: Secondary | ICD-10-CM

## 2016-06-26 ENCOUNTER — Ambulatory Visit (HOSPITAL_COMMUNITY): Payer: Self-pay

## 2016-07-01 ENCOUNTER — Telehealth (HOSPITAL_COMMUNITY): Payer: Self-pay | Admitting: *Deleted

## 2016-07-01 NOTE — Telephone Encounter (Signed)
Telephoned patient at home # and left message. Spoke with patient 06/30/2016 and patient was suppose to call back to schedule screening mammogram after looking at work schedule. Will file information in no show file since patient no showed previous appointment with [facility].

## 2016-07-23 ENCOUNTER — Telehealth (HOSPITAL_COMMUNITY): Payer: Self-pay | Admitting: *Deleted

## 2016-07-23 NOTE — Telephone Encounter (Signed)
Telephoned patient at home number and left message about [facility] appointment August 31 9:30.

## 2016-07-24 ENCOUNTER — Ambulatory Visit (HOSPITAL_COMMUNITY): Payer: Self-pay

## 2016-08-05 ENCOUNTER — Telehealth (HOSPITAL_COMMUNITY): Payer: Self-pay | Admitting: *Deleted

## 2016-08-05 NOTE — Telephone Encounter (Signed)
Telephoned patient at home number and left message to return call to [facility] 

## 2016-08-28 ENCOUNTER — Ambulatory Visit (HOSPITAL_COMMUNITY): Payer: Self-pay

## 2016-10-09 ENCOUNTER — Ambulatory Visit (HOSPITAL_COMMUNITY): Payer: Self-pay

## 2017-11-09 ENCOUNTER — Encounter (HOSPITAL_COMMUNITY): Payer: Self-pay

## 2017-12-10 ENCOUNTER — Encounter (HOSPITAL_COMMUNITY): Payer: Self-pay | Admitting: Emergency Medicine

## 2017-12-10 ENCOUNTER — Other Ambulatory Visit: Payer: Self-pay

## 2017-12-10 DIAGNOSIS — Y939 Activity, unspecified: Secondary | ICD-10-CM | POA: Insufficient documentation

## 2017-12-10 DIAGNOSIS — S61216A Laceration without foreign body of right little finger without damage to nail, initial encounter: Secondary | ICD-10-CM | POA: Insufficient documentation

## 2017-12-10 DIAGNOSIS — Z79899 Other long term (current) drug therapy: Secondary | ICD-10-CM | POA: Insufficient documentation

## 2017-12-10 DIAGNOSIS — W228XXA Striking against or struck by other objects, initial encounter: Secondary | ICD-10-CM | POA: Insufficient documentation

## 2017-12-10 DIAGNOSIS — Y929 Unspecified place or not applicable: Secondary | ICD-10-CM | POA: Insufficient documentation

## 2017-12-10 DIAGNOSIS — Y999 Unspecified external cause status: Secondary | ICD-10-CM | POA: Insufficient documentation

## 2017-12-10 NOTE — ED Triage Notes (Signed)
Pt states she broke a lamp and cut her 5th finger on her right hand  Bleeding controlled  States she feels like there may be glass in it

## 2017-12-11 ENCOUNTER — Emergency Department (HOSPITAL_COMMUNITY): Payer: Self-pay

## 2017-12-11 ENCOUNTER — Emergency Department (HOSPITAL_COMMUNITY)
Admission: EM | Admit: 2017-12-11 | Discharge: 2017-12-11 | Disposition: A | Discharge status: Home / Self Care | Payer: Self-pay | Attending: Emergency Medicine | Admitting: Emergency Medicine

## 2017-12-11 DIAGNOSIS — S61226A Laceration with foreign body of right little finger without damage to nail, initial encounter: Secondary | ICD-10-CM

## 2017-12-11 MED ORDER — TETANUS-DIPHTH-ACELL PERTUSSIS 5-2.5-18.5 LF-MCG/0.5 IM SUSP
0.5000 mL | Freq: Once | INTRAMUSCULAR | Status: AC
Start: 2017-12-11 — End: 2017-12-11
  Administered 2017-12-11: 0.5 mL via INTRAMUSCULAR
  Filled 2017-12-11: qty 0.5

## 2017-12-11 MED ORDER — LIDOCAINE HCL (PF) 1 % IJ SOLN
5.0000 mL | Freq: Once | INTRAMUSCULAR | Status: AC
  Administered 2017-12-11: 5 mL
  Filled 2017-12-11: qty 5

## 2017-12-11 NOTE — ED Provider Notes (Signed)
[facility]   CSN: [id number] Arrival date & time: 12/10/17  2140     History   Chief Complaint Chief Complaint  Patient presents with  . Extremity Laceration    HPI Vanessa Castillo is a 52 y.o. female.  Patient presents to the emergency department with a chief complaint of finger laceration.  She states that she hit something.  She states that she is concerned that she has glass in her finger.  She does not give any further information, and is unwilling to assist with her history.     The history is provided by the patient. No language interpreter was used.    Past Medical History:  Diagnosis Date  . Anemia     Patient Active Problem List   Diagnosis Date Noted  . Breast lump on left side at 4 o'clock position 11/08/2013    History reviewed. No pertinent surgical history.  OB History    Gravida Para Term Preterm AB Living   2       2 0   SAB TAB Ectopic Multiple Live Births   1 1             Home Medications    Prior to Admission medications   Medication Sig Start Date End Date Taking? Authorizing Provider  ferrous fumarate (FERRO-SEQUELS) 50 MG CR tablet Take 50 mg by mouth 3 (three) times daily with meals.    [provider]  ibuprofen (ADVIL,MOTRIN) 400 MG tablet Take 400 mg by mouth every 6 (six) hours as needed. For pain relief    [provider]  meloxicam (MOBIC) 15 MG tablet Take 1 tablet (15 mg total) by mouth daily. Patient not taking: Reported on 03/22/2015 07/09/13   Reuben Likes, MD  metroNIDAZOLE (FLAGYL) 500 MG tablet TAKE 1 TABLET TWICE DAILY Patient not taking: Reported on 03/22/2015 11/18/13   Constant, Peggy, MD  Multiple Vitamin (MULITIVITAMIN WITH MINERALS) TABS Take 1 tablet by mouth daily.    [provider]  tetrahydrozoline-zinc (VISINE-AC) 0.05-0.25 % ophthalmic solution Place 2 drops into both eyes 3 (three) times daily as needed. Eye irritation/ itchy eye     [provider]  traZODone (DESYREL) 50 MG tablet Take 0.5-1 tablets (25-50 mg total) by mouth at bedtime as needed for sleep. Patient not taking: Reported on 03/22/2015 01/12/14   Richarda Overlie, MD    Family History Family History  Problem Relation Age of Onset  . Coronary artery disease Mother   . Hypertension Mother   . Diabetes Father   . Breast cancer Paternal Aunt   . Diabetes Paternal Uncle   . Diabetes Paternal Aunt     Social History Social History   Tobacco Use  . Smoking status: Never Smoker  . Smokeless tobacco: Never Used  Substance Use Topics  . Alcohol use: Yes    Comment: socially  . Drug use: No     Allergies   Patient has no known allergies.   Review of Systems Review of Systems  Unable to perform ROS: Other (unwilling to assist with hx)     Physical Exam Updated Vital Signs BP 138/89 (BP Location: Left Arm)   Pulse 92   Temp 98.2 F (36.8 C) (Oral)   Resp 18   SpO2 98%   Physical Exam  Constitutional: She is oriented to person, place, and time. She appears well-developed and well-nourished.  HENT:  Head: Normocephalic and atraumatic.  Eyes: Conjunctivae and EOM  are normal.  Neck: Normal range of motion.  Cardiovascular: Normal rate.  Pulmonary/Chest: Effort normal.  Abdominal: She exhibits no distension.  Musculoskeletal: Normal range of motion.  Right small finger with laceration to the palmar aspect at that middle phalanx  Neurological: She is alert and oriented to person, place, and time.  Skin: Skin is dry.  Psychiatric: She has a normal mood and affect. Her behavior is normal. Judgment and thought content normal.  Nursing note and vitals reviewed.    ED Treatments / Results  Labs (all labs ordered are listed, but only abnormal results are displayed) Labs Reviewed - No data to display  EKG  EKG Interpretation None       Radiology Dg Finger Little Right  Result Date: 12/11/2017 CLINICAL DATA:  Glass foreign  body injury to right fifth finger EXAM: RIGHT LITTLE FINGER 2+V COMPARISON:  None. FINDINGS: No osseous fracture or dislocation. No suspicious focal osseous lesion. No appreciable arthropathy. Numerous clustered glass shards are present in volar soft tissues in the mid right fifth finger, largest glass fragment 8 x 4 mm. IMPRESSION: Numerous clustered glass shards in the volar soft tissues in the mid right fifth finger, largest fragment 8 x 4 mm. No osseous fracture or dislocation. Electronically Signed   By: Delbert PhenixJason A Poff M.D.   On: 12/11/2017 01:49    Procedures .Foreign Body Removal Date/Time: 12/11/2017 3:22 AM Performed by: Roxy HorsemanBrowning, Kelita Wallis, PA-C Authorized by: Roxy HorsemanBrowning, Ordean Fouts, PA-C  Consent: Verbal consent obtained. Risks and benefits: risks, benefits and alternatives were discussed Consent given by: patient Patient understanding: patient states understanding of the procedure being performed Patient consent: the patient's understanding of the procedure matches consent given Procedure consent: procedure consent matches procedure scheduled Relevant documents: relevant documents present and verified Test results: test results available and properly labeled Site marked: the operative site was marked Imaging studies: imaging studies available Required items: required blood products, implants, devices, and special equipment available Patient identity confirmed: verbally with patient Time out: Immediately prior to procedure a "time out" was called to verify the correct patient, procedure, equipment, support staff and site/side marked as required. Body area: skin General location: upper extremity Location details: right small finger Anesthesia: digital block  Anesthesia: Local Anesthetic: lidocaine 1% without epinephrine Anesthetic total: 4 mL  Sedation: Patient sedated: no  Patient restrained: no Patient cooperative: yes Localization method: probed Removal mechanism: forceps,  hemostat and irrigation Dressing: antibiotic ointment and dressing applied Tendon involvement: none Depth: subcutaneous Complexity: complex 3 objects recovered. Objects recovered: 3 large pieces of glass fragment Post-procedure assessment: foreign body removed Patient tolerance: Patient tolerated the procedure well with no immediate complications Comments: 3 large pieces of glass were removed from the laceration.  The patient requested to not have the wound closed with suture.     (including critical care time)     Medications Ordered in ED Medications  Tdap (BOOSTRIX) injection 0.5 mL (not administered)  lidocaine (PF) (XYLOCAINE) 1 % injection 5 mL (not administered)     Initial Impression / Assessment and Plan / ED Course  I have reviewed the triage vital signs and the nursing notes.  Pertinent labs & imaging results that were available during my care of the patient were reviewed by me and considered in my medical decision making (see chart for details).    Patient with several large pieces of glass in her right small finger on the palmar aspect of the middle phalanx.  There was both an entrance and exit wound  superficially.  I had to cut the skin, creating a flap to gain access to the large pieces of glass, which were then removed successfully.  There was no evidence of tendon or bone involvement.  Patient requested to have the wound dressed but not sutured.  Wound care instructions given.  Tdap updated.  Final Clinical Impressions(s) / ED Diagnoses   Final diagnoses:  Laceration of right little finger with foreign body without damage to nail, initial encounter    ED Discharge Orders    None       Roxy Horseman, PA-C 12/11/17 1610    Geoffery Lyons, MD 12/11/17 [phone]

## 2018-11-24 DEATH — deceased

## 2019-09-10 IMAGING — CR DG FINGER LITTLE 2+V*R*
3 series · 3 of 3 positions shown · non-contrast
Comparison: None.

CLINICAL DATA: Glass foreign body injury to right fifth finger

EXAM:
RIGHT LITTLE FINGER 2+V

[x finger pa right]
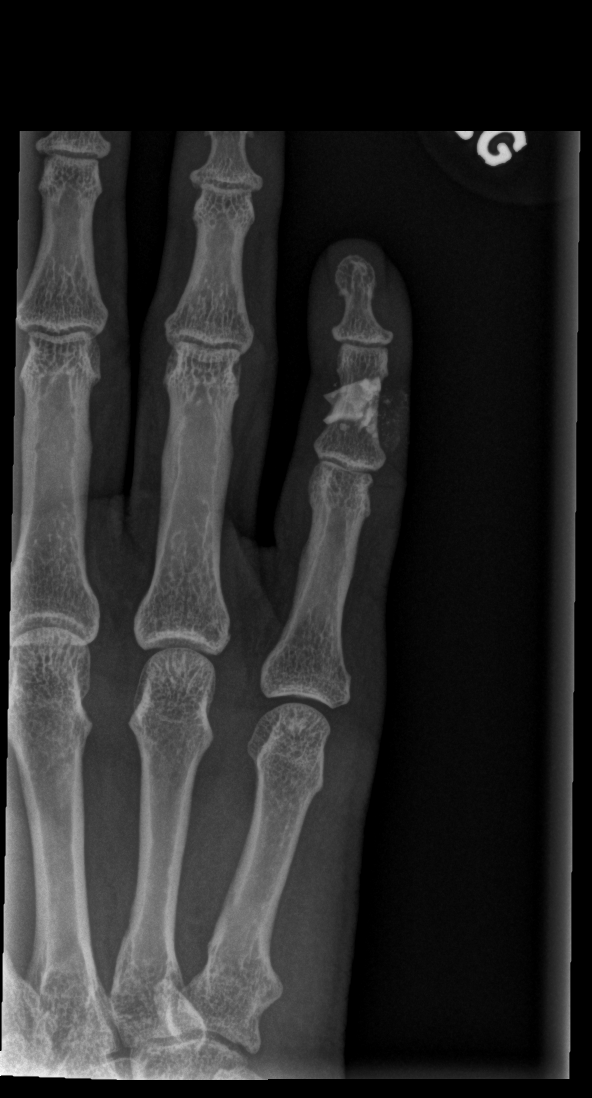

[x finger obl right]
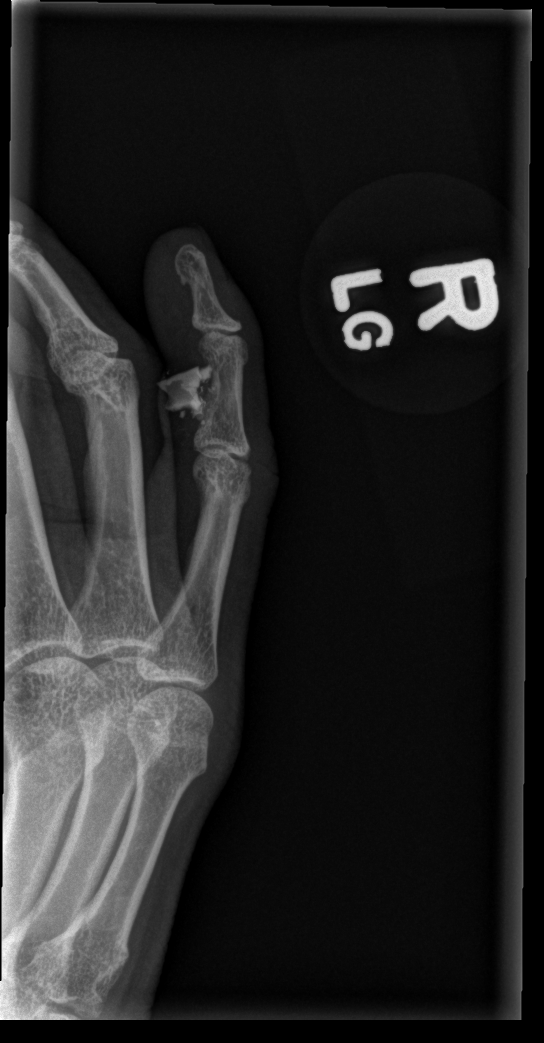

[x finger lat right]
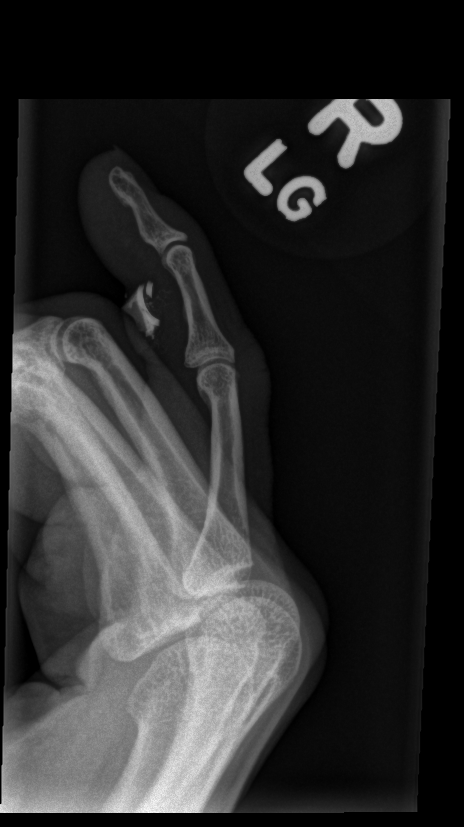

[3 of 3 positions shown; findings below may reference images not displayed]

FINDINGS: No osseous fracture or dislocation. No suspicious focal osseous
lesion. No appreciable arthropathy. Numerous clustered glass shards
are present in volar soft tissues in the mid right fifth finger,
largest glass fragment 8 x 4 mm.
IMPRESSION: Numerous clustered glass shards in the volar soft tissues in the mid
right fifth finger, largest fragment 8 x 4 mm. No osseous fracture
or dislocation.
